# Patient Record
Sex: Female | Born: 2000 | Race: White | Hispanic: No | Marital: Single | Smoking: Never smoker
Health system: Southern US, Community
[De-identification: ages and names within clinical notes are randomized; demographics above are authoritative.]

## PROBLEM LIST (undated history)

## (undated) HISTORY — PX: TONSILLECTOMY: SUR1361

---

## 2021-07-21 ENCOUNTER — Encounter (HOSPITAL_COMMUNITY): Payer: Self-pay

## 2021-07-21 ENCOUNTER — Other Ambulatory Visit: Payer: Self-pay

## 2021-07-21 ENCOUNTER — Emergency Department (HOSPITAL_COMMUNITY)

## 2021-07-21 ENCOUNTER — Emergency Department (HOSPITAL_COMMUNITY)
Admission: EM | Admit: 2021-07-21 | Discharge: 2021-07-21 | Disposition: A | Discharge status: Home / Self Care | Attending: Emergency Medicine | Admitting: Emergency Medicine

## 2021-07-21 DIAGNOSIS — W228XXA Striking against or struck by other objects, initial encounter: Secondary | ICD-10-CM | POA: Diagnosis not present

## 2021-07-21 DIAGNOSIS — S0990XA Unspecified injury of head, initial encounter: Secondary | ICD-10-CM | POA: Diagnosis present

## 2021-07-21 DIAGNOSIS — Y991 Military activity: Secondary | ICD-10-CM | POA: Insufficient documentation

## 2021-07-21 DIAGNOSIS — R000 Tachycardia, unspecified: Secondary | ICD-10-CM | POA: Insufficient documentation

## 2021-07-21 DIAGNOSIS — S060X0A Concussion without loss of consciousness, initial encounter: Secondary | ICD-10-CM | POA: Diagnosis not present

## 2021-07-21 LAB — POC URINE PREG, ED: Preg Test, Ur: NEGATIVE

## 2021-07-21 MED ORDER — ONDANSETRON HCL 4 MG PO TABS: 4.0000 mg | ORAL_TABLET | Freq: Three times a day (TID) | ORAL | 0 refills | Status: AC | PRN

## 2021-07-21 NOTE — Discharge Instructions (Addendum)
You have been evaluated for head injury.  Fortunately CT scan did not show any concerning finding.  You have symptoms concerning for concussion.  Please avoid any activities that can increase the risk of head injury until being cleared by your primary care provider.  Take Zofran as needed for nausea.  Return if you have any concern.  You may take over-the-counter Tylenol or ibuprofen as needed for pain.

## 2021-07-21 NOTE — ED Provider Notes (Signed)
[facility]-EMERGENCY DEPT Provider Note   CSN: [id number] Arrival date & time: 07/21/21  8295     History No chief complaint on file.   Courtney Lester is a 20 y.o. female.  The history is provided by the patient. No language interpreter was used.   20 year old female who presents for evaluation of head injury.  Patient states she is currently in training in the [employer] and yesterday she accidentally struck her head against a locker door as she stood up.  She report initially the pain was not intense but she has to wear a heavy helmet throughout the day for her training.  She took a Tylenol.  She went to sleep but woke up felt very nauseous and vomited once.  After vomiting, she reported symptoms did improve.  She denies any significant neck pain denies any focal numbness or focal weakness or confusion.  She did take ibuprofen prior to arrival.  Last menstrual period was 4 weeks ago.  At this time states that her headache is mild to moderate in severity to the crown of the head at the site of impact.  She denies any bleeding.  No past medical history on file.  There are no problems to display for this patient.   The histories are not reviewed yet. Please review them in the "History" navigator section and refresh this SmartLink.   OB History   No obstetric history on file.     No family history on file.     Home Medications Prior to Admission medications   Not on File    Allergies    Patient has no allergy information on record.  Review of Systems   Review of Systems  All other systems reviewed and are negative.  Physical Exam Updated Vital Signs There were no vitals taken for this visit.  Physical Exam Vitals and nursing note reviewed.  Constitutional:      General: She is not in acute distress.    Appearance: She is well-developed.  HENT:     Head: Normocephalic.     Comments: tenderness to right parietal scalp without any laceration  swelling or deformity noted.  No crepitus. Eyes:     Conjunctiva/sclera: Conjunctivae normal.  Cardiovascular:     Rate and Rhythm: Tachycardia present.  Pulmonary:     Effort: Pulmonary effort is normal.  Musculoskeletal:     Cervical back: Normal range of motion and neck supple.  Skin:    Findings: No rash.  Neurological:     Mental Status: She is alert and oriented to person, place, and time.     GCS: GCS eye subscore is 4. GCS verbal subscore is 5. GCS motor subscore is 6.     Cranial Nerves: Cranial nerves are intact. No cranial nerve deficit or dysarthria.     Sensory: Sensation is intact.     Motor: Motor function is intact.     Coordination: Coordination is intact.     Gait: Gait is intact.     Comments: Able to perform 3 word recall after 5 minutes.  Ambulate without difficulty.  Psychiatric:        Mood and Affect: Mood normal.    ED Results / Procedures / Treatments   Labs (all labs ordered are listed, but only abnormal results are displayed) Labs Reviewed  POC URINE PREG, ED    EKG None  Radiology CT Head Wo Contrast  Result Date: 07/21/2021 CLINICAL DATA:  Head trauma, minor, normal mental status.  Head injury against metal door on 07/20/2021 at 17:30. Right occipital headache. Awoke at 02:30 with nausea and vomiting. EXAM: CT HEAD WITHOUT CONTRAST TECHNIQUE: Contiguous axial images were obtained from the base of the skull through the vertex without intravenous contrast. COMPARISON:  None. FINDINGS: Brain: No evidence of parenchymal hemorrhage or extra-axial fluid collection. No mass lesion, mass effect, or midline shift. No CT evidence of acute infarction. Cerebral volume is age appropriate. No ventriculomegaly. Vascular: No acute abnormality. Skull: No evidence of calvarial fracture. Sinuses/Orbits: The visualized paranasal sinuses are essentially clear. Other:  The mastoid air cells are unopacified. IMPRESSION: Negative head CT. No evidence of acute intracranial  abnormality. No evidence of calvarial fracture. Electronically Signed   By: Delbert Phenix M.D.   On: 07/21/2021 09:04    Procedures Procedures   Medications Ordered in ED Medications - No data to display  ED Course  I have reviewed the triage vital signs and the nursing notes.  Pertinent labs & imaging results that were available during my care of the patient were reviewed by me and considered in my medical decision making (see chart for details).    MDM Rules/Calculators/A&P                           BP 126/72 (BP Location: Left Arm)   Pulse (!) 112   Temp 98.3 F (36.8 C) (Oral)   Resp 16   LMP 06/23/2021 (Approximate)   SpO2 100%   Final Clinical Impression(s) / ED Diagnoses Final diagnoses:  Minor head injury, initial encounter  Concussion without loss of consciousness, initial encounter    Rx / DC Orders ED Discharge Orders     None      8:20 AM Patient suffered a minor head injury however she did endorse nausea and vomiting therefore will obtain head CT scan to rule out intracranial injury.  Patient otherwise well.  Head and scalp examination unremarkable.  9:08 AM Pregnancy test is negative, head CT scan without any concerning finding.  Patient stable for discharge home with recommendations of concussion protocol care.  Return precaution given.   Fayrene Helper, PA-C 07/21/21 [id number]    Wynetta Fines, MD 07/21/21 [phone]

## 2021-07-21 NOTE — ED Triage Notes (Signed)
Pt reports hitting top of head on a metal locker yesterday.  C/o headache and nausea.  Pain score 5/10.  Pt reports taking tylenol w/o relief.  Denies LOC.  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  Feels wearing helmet and loud noises may have worsened condition.

## 2022-09-29 IMAGING — CT CT HEAD W/O CM
2 of 3 series · 13 of 45 positions shown, 16 images · non-contrast
Comparison: None.

CLINICAL DATA: Head trauma, minor, normal mental status. Head
injury against metal door on 07/20/2021 at [DATE]. Right occipital
headache. Awoke at [DATE] with nausea and vomiting.

EXAM:
CT HEAD WITHOUT CONTRAST
TECHNIQUE: Contiguous axial images were obtained from the base of the skull
through the vertex without intravenous contrast.

[Series 2: head wo · axial · 0.47mm/px · z∈[-105,+10]mm · 10 of 28 slices shown, 13 images]
[im 3/28  brain]
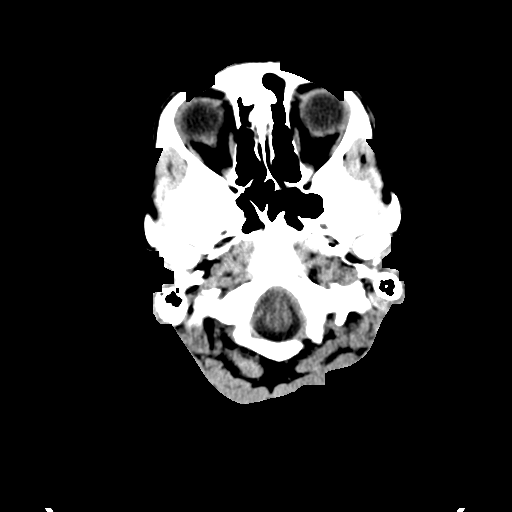
[im 3/28  bone]
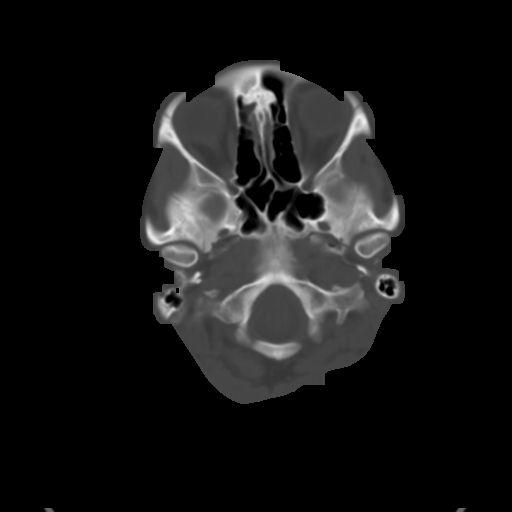
[im 5/28  brain]
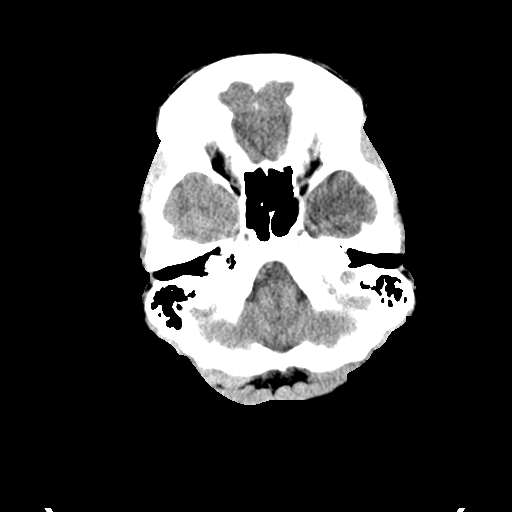
[im 8/28  brain]
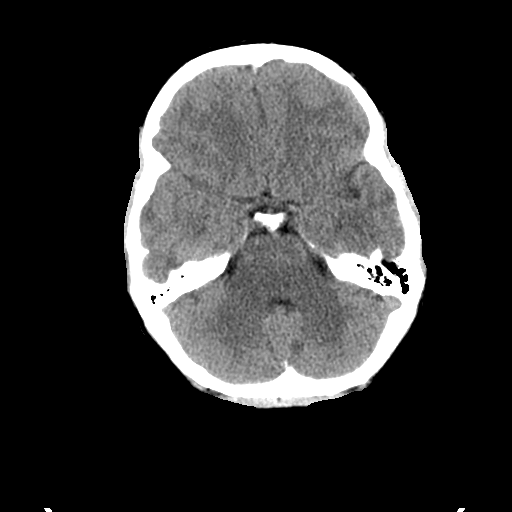
[im 11/28  brain]
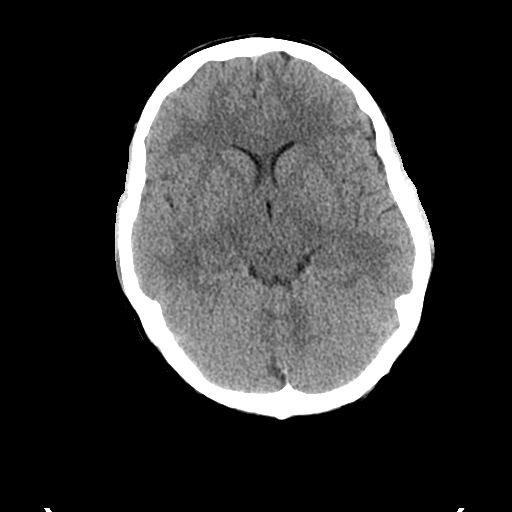
[im 13/28  brain]
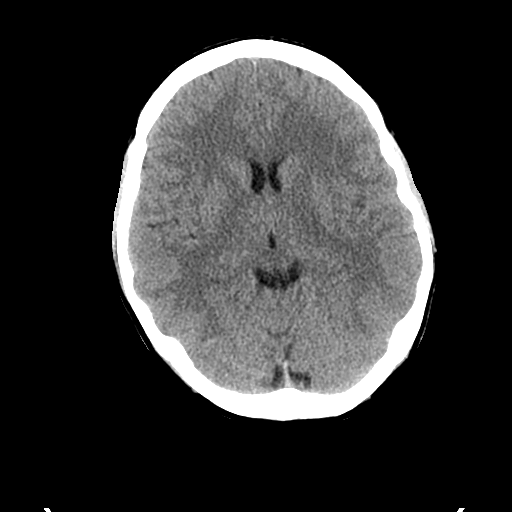
[im 13/28  bone]
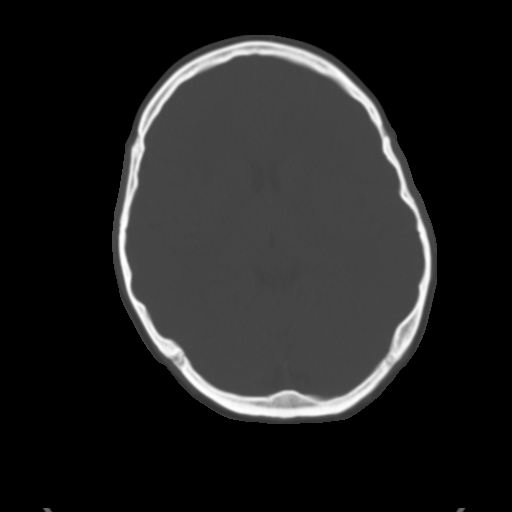
[im 16/28  brain]
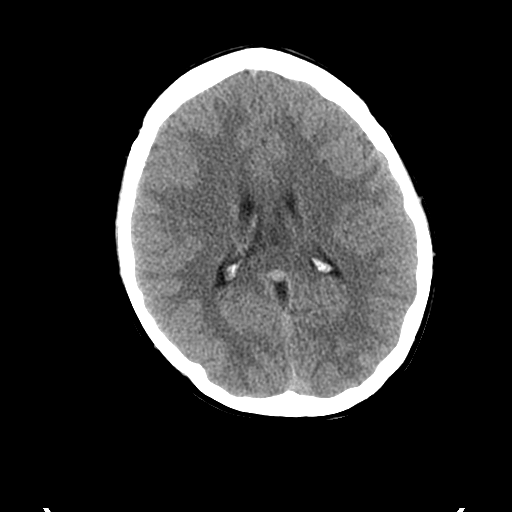
[im 18/28  brain]
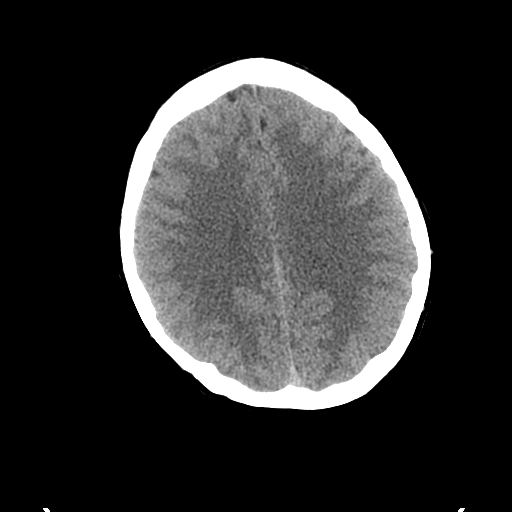
[im 21/28  brain]
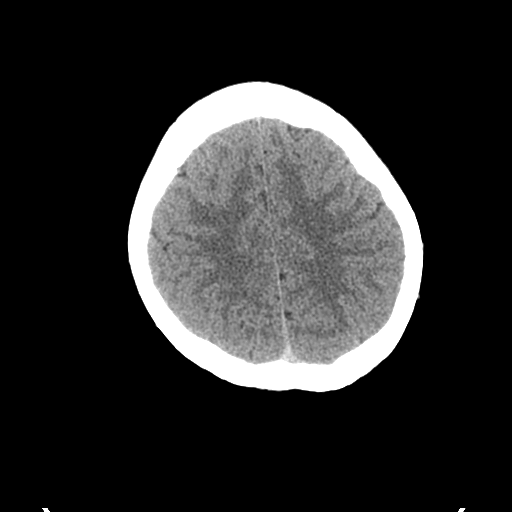
[im 24/28  brain]
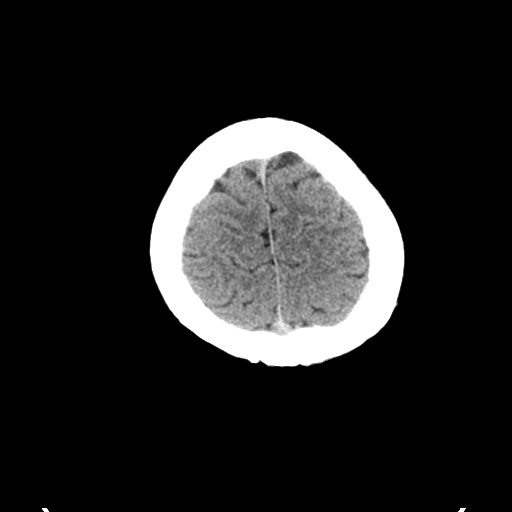
[im 24/28  bone]
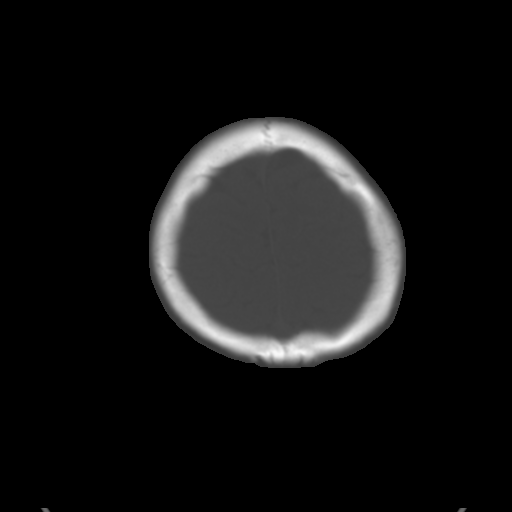
[im 26/28  brain]
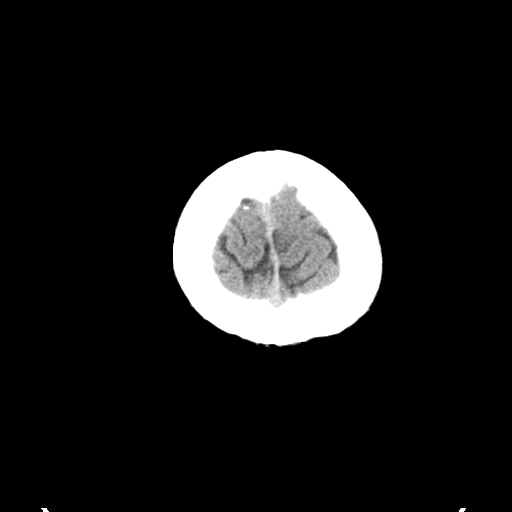

[Series 4: coronal soft tissue · coronal · 0.27mm/px · 3 of 67 slices shown]
[im 23/67  brain]
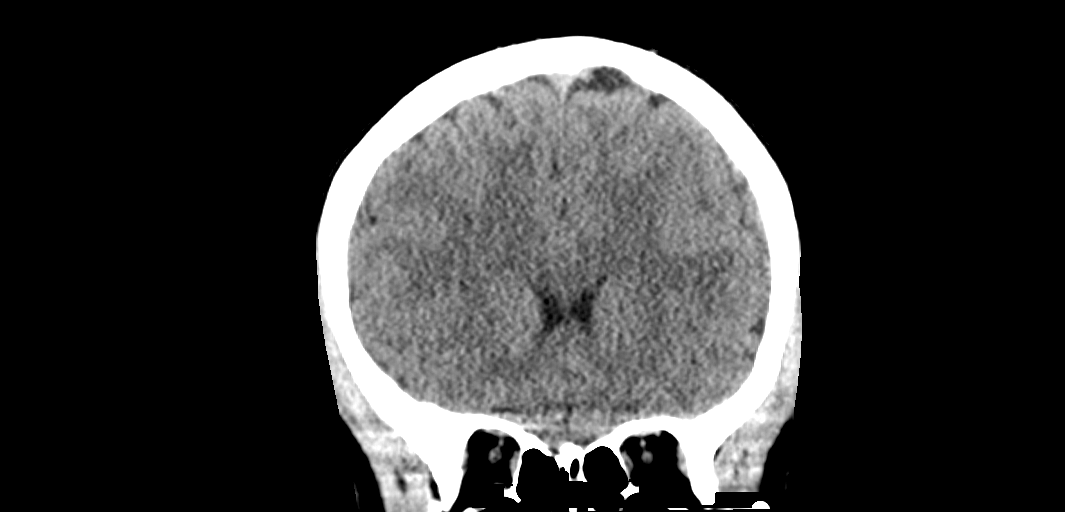
[im 30/67  brain]
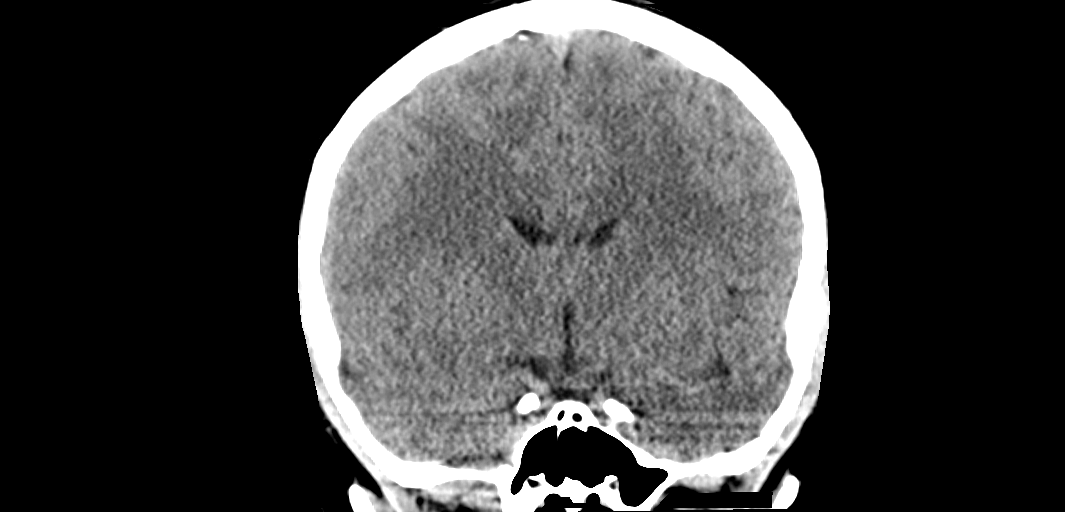
[im 37/67  brain]
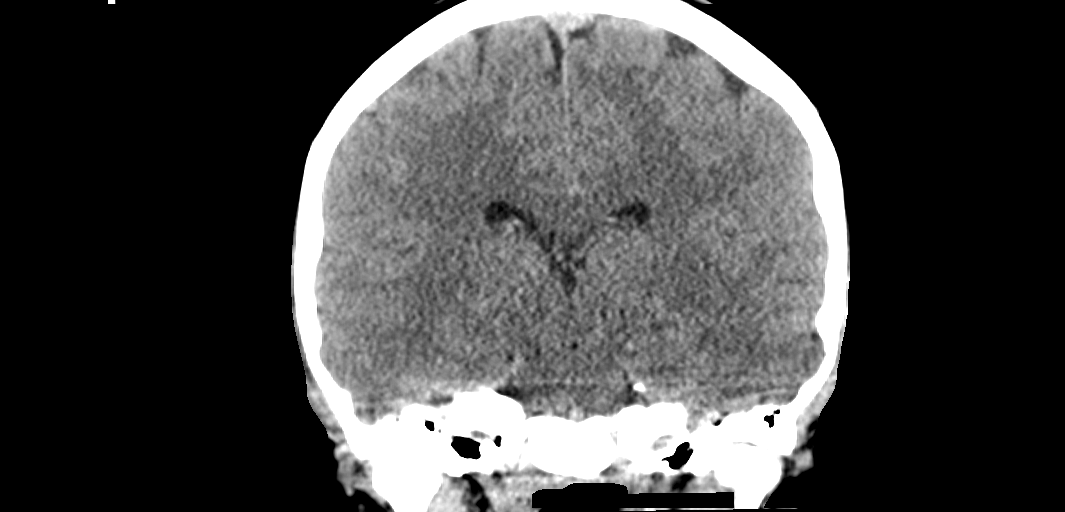

[13 of 45 positions shown; findings below may reference images not displayed]

FINDINGS: Brain: No evidence of parenchymal hemorrhage or extra-axial fluid
collection. No mass lesion, mass effect, or midline shift. No CT
evidence of acute infarction. Cerebral volume is age appropriate. No
ventriculomegaly.

Vascular: No acute abnormality.

Skull: No evidence of calvarial fracture.

Sinuses/Orbits: The visualized paranasal sinuses are essentially
clear.

Other:  The mastoid air cells are unopacified.
IMPRESSION: Negative head CT. No evidence of acute intracranial abnormality. No
evidence of calvarial fracture.
